# Patient Record
Sex: Male | Born: 1965 | Race: White | Hispanic: No | Marital: Married | Smoking: Never smoker
Health system: Southern US, Community
[De-identification: ages and names within clinical notes are randomized; demographics above are authoritative.]

---

## 2015-01-07 ENCOUNTER — Encounter: Payer: Self-pay | Admitting: Emergency Medicine

## 2015-01-07 ENCOUNTER — Emergency Department: Payer: No Typology Code available for payment source

## 2015-01-07 ENCOUNTER — Emergency Department
Admission: EM | Admit: 2015-01-07 | Discharge: 2015-01-07 | Disposition: A | Discharge status: Home / Self Care | Payer: No Typology Code available for payment source | Attending: Emergency Medicine | Admitting: Emergency Medicine

## 2015-01-07 DIAGNOSIS — Q893 Situs inversus: Secondary | ICD-10-CM | POA: Insufficient documentation

## 2015-01-07 DIAGNOSIS — N201 Calculus of ureter: Secondary | ICD-10-CM | POA: Insufficient documentation

## 2015-01-07 DIAGNOSIS — R197 Diarrhea, unspecified: Secondary | ICD-10-CM | POA: Diagnosis not present

## 2015-01-07 DIAGNOSIS — R1032 Left lower quadrant pain: Secondary | ICD-10-CM | POA: Diagnosis present

## 2015-01-07 LAB — COMPREHENSIVE METABOLIC PANEL
ALT: 27 U/L (ref 17–63)
ANION GAP: 10 (ref 5–15)
AST: 28 U/L (ref 15–41)
Albumin: 4.6 g/dL (ref 3.5–5.0)
Alkaline Phosphatase: 56 U/L (ref 38–126)
BUN: 17 mg/dL (ref 6–20)
CHLORIDE: 108 mmol/L (ref 101–111)
CO2: 26 mmol/L (ref 22–32)
Calcium: 9.2 mg/dL (ref 8.9–10.3)
Creatinine, Ser: 1.13 mg/dL (ref 0.61–1.24)
GFR calc Af Amer: >60 mL/min (ref >60)
GFR calc non Af Amer: >60 mL/min (ref >60)
GLUCOSE: 183 mg/dL — AB (ref 65–99)
Potassium: 3.7 mmol/L (ref 3.5–5.1)
SODIUM: 144 mmol/L (ref 135–145)
TOTAL PROTEIN: 7.3 g/dL (ref 6.5–8.1)
Total Bilirubin: 0.8 mg/dL (ref 0.3–1.2)

## 2015-01-07 LAB — CBC WITH DIFFERENTIAL/PLATELET
BASOS PCT: 1 %
Basophils Absolute: 0.1 10*3/uL (ref 0–0.1)
Eosinophils Absolute: 0.2 10*3/uL (ref 0–0.7)
Eosinophils Relative: 1 %
HEMATOCRIT: 45.2 % (ref 40.0–52.0)
HEMOGLOBIN: 14.9 g/dL (ref 13.0–18.0)
LYMPHS PCT: 21 %
Lymphs Abs: 2.8 10*3/uL (ref 1.0–3.6)
MCH: 29 pg (ref 26.0–34.0)
MCHC: 32.9 g/dL (ref 32.0–36.0)
MCV: 88.2 fL (ref 80.0–100.0)
Monocytes Absolute: 0.9 10*3/uL (ref 0.2–1.0)
Monocytes Relative: 6 %
Neutro Abs: 9.4 10*3/uL — ABNORMAL HIGH (ref 1.4–6.5)
Neutrophils Relative %: 71 %
PLATELETS: 236 10*3/uL (ref 150–440)
RBC: 5.13 MIL/uL (ref 4.40–5.90)
RDW: 13.4 % (ref 11.5–14.5)
WBC: 13.4 10*3/uL — ABNORMAL HIGH (ref 3.8–10.6)

## 2015-01-07 LAB — URINALYSIS COMPLETE WITH MICROSCOPIC (ARMC ONLY)
BILIRUBIN URINE: NEGATIVE
Glucose, UA: NEGATIVE mg/dL
Leukocytes, UA: NEGATIVE
Nitrite: NEGATIVE
Protein, ur: 100 mg/dL — AB
SPECIFIC GRAVITY, URINE: 1.028 (ref 1.005–1.030)
Squamous Epithelial / LPF: NONE SEEN
pH: 5 (ref 5.0–8.0)

## 2015-01-07 MED ORDER — HYDROMORPHONE HCL 1 MG/ML IJ SOLN
1.0000 mg | Freq: Once | INTRAMUSCULAR | Status: AC
  Administered 2015-01-07: 1 mg via INTRAVENOUS

## 2015-01-07 MED ORDER — TAMSULOSIN HCL 0.4 MG PO CAPS: 0.4000 mg | ORAL_CAPSULE | Freq: Every day | ORAL | Status: AC

## 2015-01-07 MED ORDER — HYDROMORPHONE HCL 1 MG/ML IJ SOLN
INTRAMUSCULAR | Status: AC
  Filled 2015-01-07: qty 1

## 2015-01-07 MED ORDER — TAMSULOSIN HCL 0.4 MG PO CAPS
ORAL_CAPSULE | ORAL | Status: AC
  Administered 2015-01-07: 0.4 mg via ORAL
  Filled 2015-01-07: qty 1

## 2015-01-07 MED ORDER — ONDANSETRON 4 MG PO TBDP
4.0000 mg | ORAL_TABLET | Freq: Once | ORAL | Status: AC
Start: 2015-01-07 — End: 2015-01-07
  Administered 2015-01-07: 4 mg via ORAL

## 2015-01-07 MED ORDER — ONDANSETRON 4 MG PO TBDP
ORAL_TABLET | ORAL | Status: AC
  Administered 2015-01-07: 4 mg via ORAL
  Filled 2015-01-07: qty 1

## 2015-01-07 MED ORDER — SODIUM CHLORIDE 0.9 % IV BOLUS (SEPSIS)
1000.0000 mL | Freq: Once | INTRAVENOUS | Status: AC
  Administered 2015-01-07: 1000 mL via INTRAVENOUS

## 2015-01-07 MED ORDER — HYDROMORPHONE HCL 1 MG/ML IJ SOLN
INTRAMUSCULAR | Status: AC
  Administered 2015-01-07: 1 mg via INTRAVENOUS
  Filled 2015-01-07: qty 1

## 2015-01-07 MED ORDER — ONDANSETRON HCL 4 MG PO TABS: 4.0000 mg | ORAL_TABLET | Freq: Three times a day (TID) | ORAL | Status: AC | PRN

## 2015-01-07 MED ORDER — IOHEXOL 300 MG/ML  SOLN
100.0000 mL | Freq: Once | INTRAMUSCULAR | Status: AC | PRN
  Administered 2015-01-07: 100 mL via INTRAVENOUS

## 2015-01-07 MED ORDER — ONDANSETRON HCL 4 MG/2ML IJ SOLN
4.0000 mg | Freq: Once | INTRAMUSCULAR | Status: AC
  Administered 2015-01-07: 4 mg via INTRAVENOUS

## 2015-01-07 MED ORDER — KETOROLAC TROMETHAMINE 30 MG/ML IJ SOLN: 30.0000 mg | Freq: Once | INTRAMUSCULAR | Status: DC

## 2015-01-07 MED ORDER — OXYCODONE-ACETAMINOPHEN 5-325 MG PO TABS: 1.0000 | ORAL_TABLET | Freq: Four times a day (QID) | ORAL | Status: DC | PRN

## 2015-01-07 MED ORDER — KETOROLAC TROMETHAMINE 30 MG/ML IJ SOLN
INTRAMUSCULAR | Status: AC
  Administered 2015-01-07: 30 mg via INTRAVENOUS
  Filled 2015-01-07: qty 1

## 2015-01-07 MED ORDER — TAMSULOSIN HCL 0.4 MG PO CAPS
0.4000 mg | ORAL_CAPSULE | Freq: Once | ORAL | Status: AC
  Administered 2015-01-07: 0.4 mg via ORAL

## 2015-01-07 MED ORDER — FENTANYL CITRATE (PF) 100 MCG/2ML IJ SOLN
75.0000 ug | Freq: Once | INTRAMUSCULAR | Status: AC
  Administered 2015-01-07: 75 ug via INTRAVENOUS

## 2015-01-07 MED ORDER — IOHEXOL 240 MG/ML SOLN
25.0000 mL | Freq: Once | INTRAMUSCULAR | Status: AC | PRN
  Administered 2015-01-07: 25 mL via ORAL

## 2015-01-07 MED ORDER — KETOROLAC TROMETHAMINE 30 MG/ML IJ SOLN
30.0000 mg | Freq: Once | INTRAMUSCULAR | Status: AC
  Administered 2015-01-07: 30 mg via INTRAVENOUS

## 2015-01-07 MED ORDER — ONDANSETRON HCL 4 MG PO TABS: 4.0000 mg | ORAL_TABLET | Freq: Once | ORAL | Status: DC

## 2015-01-07 MED ORDER — ONDANSETRON HCL 4 MG/2ML IJ SOLN
INTRAMUSCULAR | Status: AC
  Administered 2015-01-07: 4 mg via INTRAVENOUS
  Filled 2015-01-07: qty 2

## 2015-01-07 MED ORDER — HYDROMORPHONE HCL 1 MG/ML IJ SOLN
INTRAMUSCULAR | Status: AC
Start: 2015-01-07 — End: 2015-01-07
  Administered 2015-01-07: 1 mg via INTRAVENOUS
  Filled 2015-01-07: qty 1

## 2015-01-07 MED ORDER — FENTANYL CITRATE (PF) 100 MCG/2ML IJ SOLN
INTRAMUSCULAR | Status: AC
  Administered 2015-01-07: 75 ug via INTRAVENOUS
  Filled 2015-01-07: qty 2

## 2015-01-07 NOTE — ED Notes (Signed)
MD aware of patients vitals

## 2015-01-07 NOTE — ED Notes (Signed)
When RN was discharging pt, pt vomited liquid several times in the hallway; brought patient back to room and notified MD.

## 2015-01-07 NOTE — ED Notes (Signed)
Pt in room with family at bedside.

## 2015-01-07 NOTE — ED Notes (Signed)
MD reassessed pt's  SPO2 . Pt able to go home.

## 2015-01-07 NOTE — ED Notes (Signed)
Pt back from CT. MD aware of pt's pain . MD to update patient on care plan.

## 2015-01-07 NOTE — Discharge Instructions (Signed)

## 2015-01-07 NOTE — ED Notes (Signed)
Pt states that he passed blood in his urine yesterday, states that after he voided an hour ago he started having severe pain in his left flank and llq, pt is vomiting with the pain, no hx of kidney stones

## 2015-01-07 NOTE — ED Provider Notes (Addendum)
[facility] Emergency Department Provider Note  ____________________________________________  Time seen: Approximately 445 PM  I have reviewed the triage vital signs and the nursing notes.   HISTORY  Chief Complaint Flank Pain    HPI Jon Fitzgerald is a 49 y.o. male with a family history of kidney stones who presents today with left flank pain radiating into his back and left lower quadrant with blood in his urine times one yesterday. Patient is having worsening pain today with nausea vomiting and diarrhea. Pain is severe and sharp. No blood in the vomit or diarrhea.No burning on urination.   History reviewed. No pertinent past medical history.  There are no active problems to display for this patient.   History reviewed. No pertinent past surgical history.  No current outpatient prescriptions on file.  Allergies Review of patient's allergies indicates no known allergies.  No family history on file.  Social History History  Substance Use Topics  . Smoking status: Never Smoker   . Smokeless tobacco: Not on file  . Alcohol Use: No    Review of Systems Constitutional: No fever/chills Eyes: No visual changes. ENT: No sore throat. Cardiovascular: Denies chest pain. Respiratory: Denies shortness of breath. Gastrointestinal: As above  Genitourinary: Negative for dysuria. Musculoskeletal: Left lower lumbar pain  Skin: Negative for rash. Neurological: Negative for headaches, focal weakness or numbness.  10-point ROS otherwise negative.  ____________________________________________   PHYSICAL EXAM:  VITAL SIGNS: ED Triage Vitals  Enc Vitals Group     BP 01/07/15 1510 133/110 mmHg     Pulse Rate 01/07/15 1510 100     Resp --      Temp 01/07/15 1510 98.2 F (36.8 C)     Temp Source 01/07/15 1510 Oral     SpO2 01/07/15 1510 96 %     Weight 01/07/15 1510 175 lb (79.379 kg)     Height 01/07/15 1510  (1.778 m)     Head Cir --       Peak Flow --      Pain Score 01/07/15 1511 9     Pain Loc --      Pain Edu? --      Excl. in GC? --     Constitutional: Alert and oriented. Well appearing and in no acute distress. Eyes: Conjunctivae are normal. PERRL. EOMI. Head: Atraumatic. Nose: No congestion/rhinnorhea. Mouth/Throat: Mucous membranes are moist.  Oropharynx non-erythematous. Neck: No stridor.   Cardiovascular: Normal rate, regular rhythm. Grossly normal heart sounds.  Good peripheral circulation. Respiratory: Normal respiratory effort.  No retractions. Lungs CTAB. Gastrointestinal: Soft and nontender. No distention. No abdominal bruits. No CVA tenderness. Musculoskeletal: No lower extremity tenderness nor edema.  No joint effusions. Neurologic:  Normal speech and language. No gross focal neurologic deficits are appreciated. Speech is normal. No gait instability. Skin:  Skin is warm, dry and intact. No rash noted. Psychiatric: Mood and affect are normal. Speech and behavior are normal.  ____________________________________________   LABS (all labs ordered are listed, but only abnormal results are displayed)  Labs Reviewed  CBC WITH DIFFERENTIAL/PLATELET - Abnormal; Notable for the following:    WBC 13.4 (*)    Neutro Abs 9.4 (*)    All other components within normal limits  URINALYSIS COMPLETEWITH MICROSCOPIC ([facility] ONLY) - Abnormal; Notable for the following:    Color, Urine AMBER (*)    APPearance CLOUDY (*)    Ketones, ur TRACE (*)    Hgb urine dipstick 3+ (*)  Protein, ur 100 (*)    Bacteria, UA RARE (*)    All other components within normal limits  COMPREHENSIVE METABOLIC PANEL - Abnormal; Notable for the following:    Glucose, Bld 183 (*)    All other components within normal limits   ____________________________________________  EKG   ____________________________________________  RADIOLOGY  Potential bilateral nonobstructing renal calculi. No evidence renal mass or hydronephrosis.  Spleen on right and liver and left ____________________________________________   PROCEDURES    ____________________________________________   INITIAL IMPRESSION / ASSESSMENT AND PLAN / ED COURSE  Pertinent labs & imaging results that were available during my care of the patient were reviewed by me and considered in my medical decision making (see chart for details).  ----------------------------------------- 5:45 PM on 01/07/2015 -----------------------------------------  Patient now resting comfortably after multiple doses of IV pain medications. Because of likely situs inversus on the ultrasound I will proceed with a CAT scan secondary to left lower quadrant tenderness with possible appendicitis.  ----------------------------------------- 8:13 PM on 01/07/2015 -----------------------------------------  Recent pain now controlled after several doses of Dilaudid. Discussed case with Dr.Escridge of urology. Aware of stone position as proximal in size of stone and this patient is appropriate for trial of outpatient treatment. We will give patient the phone number for urology on call here for follow-up. However, the patient does [facility] I advised the patient that he may need to call his insurance company to see what urologist is in his network for follow-up. The patient also understands that he will need to return if he feels like he is developing a fever worse having chills or uncontrolled pain. We'll discharge with a urine strainer. Discussed also the gallstone in the gallbladder neck with the patient. However, the patient continues to have no left upper quadrant tenderness to palpation. I did advise the patient that whenever he goes to the doctor from now on he needs to tell his provider that his organs are on the opposite side of his belly. The patient understands the plan and is willing to comply. We'll discharge to  home. 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    Myrna Blazeravid Matthew Schaevitz, MD 01/07/15 2015  Age and observed for about 1.5 hours in addition because feeling very groggy and hypoxic to 80s on room air. Likely secondary to opiates. Patient also with several episodes of vomiting however patient saying now that he feels good does not feel groggy anymore. Last saturation on room air is 95%. We'll give Zofran prescription to go home with in addition other scripts. We'll discharge.  Myrna Blazeravid Matthew Schaevitz, MD 01/07/15 [id number]

## 2015-01-10 ENCOUNTER — Emergency Department: Payer: No Typology Code available for payment source

## 2015-01-10 ENCOUNTER — Emergency Department
Admission: EM | Admit: 2015-01-10 | Discharge: 2015-01-10 | Disposition: A | Discharge status: Home / Self Care | Payer: No Typology Code available for payment source | Attending: Emergency Medicine | Admitting: Emergency Medicine

## 2015-01-10 DIAGNOSIS — N200 Calculus of kidney: Secondary | ICD-10-CM | POA: Insufficient documentation

## 2015-01-10 DIAGNOSIS — R109 Unspecified abdominal pain: Secondary | ICD-10-CM | POA: Diagnosis present

## 2015-01-10 DIAGNOSIS — K8020 Calculus of gallbladder without cholecystitis without obstruction: Secondary | ICD-10-CM | POA: Insufficient documentation

## 2015-01-10 DIAGNOSIS — R1013 Epigastric pain: Secondary | ICD-10-CM

## 2015-01-10 DIAGNOSIS — Z79899 Other long term (current) drug therapy: Secondary | ICD-10-CM | POA: Insufficient documentation

## 2015-01-10 LAB — COMPREHENSIVE METABOLIC PANEL
ALBUMIN: 3.6 g/dL (ref 3.5–5.0)
ALT: 19 U/L (ref 17–63)
ANION GAP: 10 (ref 5–15)
AST: 17 U/L (ref 15–41)
Alkaline Phosphatase: 51 U/L (ref 38–126)
BILIRUBIN TOTAL: 0.9 mg/dL (ref 0.3–1.2)
BUN: 17 mg/dL (ref 6–20)
CALCIUM: 8.5 mg/dL — AB (ref 8.9–10.3)
CO2: 24 mmol/L (ref 22–32)
Chloride: 107 mmol/L (ref 101–111)
Creatinine, Ser: 1.71 mg/dL — ABNORMAL HIGH (ref 0.61–1.24)
GFR calc Af Amer: 53 mL/min — ABNORMAL LOW (ref >60)
GFR calc non Af Amer: 46 mL/min — ABNORMAL LOW (ref >60)
Glucose, Bld: 127 mg/dL — ABNORMAL HIGH (ref 65–99)
Potassium: 3.7 mmol/L (ref 3.5–5.1)
Sodium: 141 mmol/L (ref 135–145)
TOTAL PROTEIN: 6.4 g/dL — AB (ref 6.5–8.1)

## 2015-01-10 LAB — URINALYSIS COMPLETE WITH MICROSCOPIC (ARMC ONLY)
Bilirubin Urine: NEGATIVE
Glucose, UA: NEGATIVE mg/dL
LEUKOCYTES UA: NEGATIVE
Nitrite: NEGATIVE
PH: 5 (ref 5.0–8.0)
Protein, ur: NEGATIVE mg/dL
SPECIFIC GRAVITY, URINE: 1.019 (ref 1.005–1.030)
SQUAMOUS EPITHELIAL / LPF: NONE SEEN

## 2015-01-10 LAB — CBC
HCT: 41 % (ref 40.0–52.0)
HEMOGLOBIN: 14.1 g/dL (ref 13.0–18.0)
MCH: 30 pg (ref 26.0–34.0)
MCHC: 34.3 g/dL (ref 32.0–36.0)
MCV: 87.4 fL (ref 80.0–100.0)
PLATELETS: 172 10*3/uL (ref 150–440)
RBC: 4.7 MIL/uL (ref 4.40–5.90)
RDW: 13.2 % (ref 11.5–14.5)
WBC: 10.4 10*3/uL (ref 3.8–10.6)

## 2015-01-10 MED ORDER — MORPHINE SULFATE 4 MG/ML IJ SOLN
INTRAMUSCULAR | Status: AC
  Administered 2015-01-10: 4 mg via INTRAVENOUS
  Filled 2015-01-10: qty 1

## 2015-01-10 MED ORDER — MORPHINE SULFATE 4 MG/ML IJ SOLN
4.0000 mg | Freq: Once | INTRAMUSCULAR | Status: AC
  Administered 2015-01-10: 4 mg via INTRAVENOUS

## 2015-01-10 MED ORDER — KETOROLAC TROMETHAMINE 30 MG/ML IJ SOLN
30.0000 mg | Freq: Once | INTRAMUSCULAR | Status: AC
  Administered 2015-01-10: 30 mg via INTRAVENOUS

## 2015-01-10 MED ORDER — SODIUM CHLORIDE 0.9 % IV BOLUS (SEPSIS)
1000.0000 mL | Freq: Once | INTRAVENOUS | Status: AC
  Administered 2015-01-10: 1000 mL via INTRAVENOUS

## 2015-01-10 MED ORDER — MORPHINE SULFATE 2 MG/ML IJ SOLN
INTRAMUSCULAR | Status: AC
  Administered 2015-01-10: 2 mg via INTRAVENOUS
  Filled 2015-01-10: qty 1

## 2015-01-10 MED ORDER — OXYCODONE-ACETAMINOPHEN 5-325 MG PO TABS: 2.0000 | ORAL_TABLET | ORAL | Status: AC | PRN

## 2015-01-10 MED ORDER — MORPHINE SULFATE 2 MG/ML IJ SOLN
2.0000 mg | Freq: Once | INTRAMUSCULAR | Status: AC
  Administered 2015-01-10: 2 mg via INTRAVENOUS

## 2015-01-10 MED ORDER — ONDANSETRON HCL 4 MG/2ML IJ SOLN
4.0000 mg | Freq: Once | INTRAMUSCULAR | Status: AC
  Administered 2015-01-10: 4 mg via INTRAVENOUS

## 2015-01-10 MED ORDER — ONDANSETRON HCL 4 MG/2ML IJ SOLN
INTRAMUSCULAR | Status: AC
  Administered 2015-01-10: 4 mg via INTRAVENOUS
  Filled 2015-01-10: qty 2

## 2015-01-10 MED ORDER — KETOROLAC TROMETHAMINE 30 MG/ML IJ SOLN
INTRAMUSCULAR | Status: AC
  Administered 2015-01-10: 30 mg via INTRAVENOUS
  Filled 2015-01-10: qty 11

## 2015-01-10 NOTE — ED Notes (Signed)
Pt to triage via w/c, appears uncomfortable; pt reports kidney stone; c/o left side pain radiating into lower abd accomp by nausea since Sunday (seen here)

## 2015-01-10 NOTE — ED Notes (Signed)
Pt alert and oriented X4, active, cooperative, pt in NAD. RR even and unlabored, color WNL.  Pt informed to return if any life threatening symptoms occur.   

## 2015-01-10 NOTE — Discharge Instructions (Signed)
Cholelithiasis Cholelithiasis (also called gallstones) is a form of gallbladder disease in which gallstones form in your gallbladder. The gallbladder is an organ that stores bile made in the liver, which helps digest fats. Gallstones begin as small crystals and slowly grow into stones. Gallstone pain occurs when the gallbladder spasms and a gallstone is blocking the duct. Pain can also occur when a stone passes out of the duct.  RISK FACTORS  Being male.   Having multiple pregnancies. Health care providers sometimes advise removing diseased gallbladders before future pregnancies.   Being obese.  Eating a diet heavy in fried foods and fat.   Being older than 60 years and increasing age.   Prolonged use of medicines containing male hormones.   Having diabetes mellitus.   Rapidly losing weight.   Having a family history of gallstones (heredity).  SYMPTOMS  Nausea.   Vomiting.  Abdominal pain.   Yellowing of the skin (jaundice).   Sudden pain. It may persist from several minutes to several hours.  Fever.   Tenderness to the touch. In some cases, when gallstones do not move into the bile duct, people have no pain or symptoms. These are called "silent" gallstones.  TREATMENT Silent gallstones do not need treatment. In severe cases, emergency surgery may be required. Options for treatment include:  Surgery to remove the gallbladder. This is the most common treatment.  Medicines. These do not always work and may take 6-12 months or more to work.  Shock wave treatment (extracorporeal biliary lithotripsy). In this treatment an ultrasound machine sends shock waves to the gallbladder to break gallstones into smaller pieces that can pass into the intestines or be dissolved by medicine. HOME CARE INSTRUCTIONS   Only take over-the-counter or prescription medicines for pain, discomfort, or fever as directed by your health care provider.   Follow a low-fat diet until  seen again by your health care provider. Fat causes the gallbladder to contract, which can result in pain.   Follow up with your health care provider as directed. Attacks are almost always recurrent and surgery is usually required for permanent treatment.  SEEK IMMEDIATE MEDICAL CARE IF:   Your pain increases and is not controlled by medicines.   You have a fever or persistent symptoms for more than 2-3 days.   You have a fever and your symptoms suddenly get worse.   You have persistent nausea and vomiting.  MAKE SURE YOU:   Understand these instructions.  Will watch your condition.  Will get help right away if you are not doing well or get worse. Document Released: 07/17/2005 Document Revised: 03/23/2013 Document Reviewed: 01/12/2013 [facility] Patient Information 2015 [place], [place]. This information is not intended to replace advice given to you by your health care provider. Make sure you discuss any questions you have with your health care provider.  Kidney Stones Kidney stones (urolithiasis) are deposits that form inside your kidneys. The intense pain is caused by the stone moving through the urinary tract. When the stone moves, the ureter goes into spasm around the stone. The stone is usually passed in the urine.  CAUSES   A disorder that makes certain neck glands produce too much parathyroid hormone (primary hyperparathyroidism).  A buildup of uric acid crystals, similar to gout in your joints.  Narrowing (stricture) of the ureter.  A kidney obstruction present at birth (congenital obstruction).  Previous surgery on the kidney or ureters.  Numerous kidney infections. SYMPTOMS   Feeling sick to your stomach (nauseous).  Throwing up (vomiting).  Blood in the urine (hematuria).  Pain that usually spreads (radiates) to the groin.  Frequency or urgency of urination. DIAGNOSIS   Taking a history and physical exam.  Blood or urine tests.  CT  scan.  Occasionally, an examination of the inside of the urinary bladder (cystoscopy) is performed. TREATMENT   Observation.  Increasing your fluid intake.  Extracorporeal shock wave lithotripsy--This is a noninvasive procedure that uses shock waves to break up kidney stones.  Surgery may be needed if you have severe pain or persistent obstruction. There are various surgical procedures. Most of the procedures are performed with the use of small instruments. Only small incisions are needed to accommodate these instruments, so recovery time is minimized. The size, location, and chemical composition are all important variables that will determine the proper choice of action for you. Talk to your health care provider to better understand your situation so that you will minimize the risk of injury to yourself and your kidney.  HOME CARE INSTRUCTIONS   Drink enough water and fluids to keep your urine clear or pale yellow. This will help you to pass the stone or stone fragments.  Strain all urine through the provided strainer. Keep all particulate matter and stones for your health care provider to see. The stone causing the pain may be as small as a grain of salt. It is very important to use the strainer each and every time you pass your urine. The collection of your stone will allow your health care provider to analyze it and verify that a stone has actually passed. The stone analysis will often identify what you can do to reduce the incidence of recurrences.  Only take over-the-counter or prescription medicines for pain, discomfort, or fever as directed by your health care provider.  Make a follow-up appointment with your health care provider as directed.  Get follow-up X-rays if required. The absence of pain does not always mean that the stone has passed. It may have only stopped moving. If the urine remains completely obstructed, it can cause loss of kidney function or even complete destruction  of the kidney. It is your responsibility to make sure X-rays and follow-ups are completed. Ultrasounds of the kidney can show blockages and the status of the kidney. Ultrasounds are not associated with any radiation and can be performed easily in a matter of minutes. SEEK MEDICAL CARE IF:  You experience pain that is progressive and unresponsive to any pain medicine you have been prescribed. SEEK IMMEDIATE MEDICAL CARE IF:   Pain cannot be controlled with the prescribed medicine.  You have a fever or shaking chills.  The severity or intensity of pain increases over 18 hours and is not relieved by pain medicine.  You develop a new onset of abdominal pain.  You feel faint or pass out.  You are unable to urinate. MAKE SURE YOU:   Understand these instructions.  Will watch your condition.  Will get help right away if you are not doing well or get worse. Document Released: 07/21/2005 Document Revised: 03/23/2013 Document Reviewed: 12/22/2012 [facility] Patient Information 2015 [place], [place]. This information is not intended to replace advice given to you by your health care provider. Make sure you discuss any questions you have with your health care provider.

## 2015-01-12 NOTE — ED Provider Notes (Signed)
[facility] Emergency Department Provider Note  ____________________________________________  Time seen: 3:40 AM  I have reviewed the triage vital signs and the nursing notes.   HISTORY  Chief Complaint Flank Pain      HPI Jon Fitzgerald is a 49 y.o. male presents with worsening left flank pain. Of note patient was recently diagnosed with a kidney stone on 01/07/2015. Patient states pain is uncontrolled at home current pain score is 10 out of 10. Patient denies any aggravating or alleviating factors.   Past medical history Kidney stone Situs ambiguous    History reviewed. No pertinent past surgical history.  Current Outpatient Rx  Name  Route  Sig  Dispense  Refill  . oxyCODONE-acetaminophen (PERCOCET/ROXICET) 5-325 MG per tablet   Oral   Take 1-2 tablets by mouth every 6 (six) hours as needed for severe pain.         . tamsulosin (FLOMAX) 0.4 MG CAPS capsule   Oral   Take 1 capsule (0.4 mg total) by mouth daily.   30 capsule   0   . ondansetron (ZOFRAN) 4 MG tablet   Oral   Take 1 tablet (4 mg total) by mouth every 8 (eight) hours as needed for nausea or vomiting. Patient not taking: Reported on 01/10/2015   10 tablet   0   . oxyCODONE-acetaminophen (ROXICET) 5-325 MG per tablet   Oral   Take 2 tablets by mouth every 4 (four) hours as needed.   20 tablet   0     Allergies Review of patient's allergies indicates no known allergies.  History reviewed. No pertinent family history.  Social History History  Substance Use Topics  . Smoking status: Never Smoker   . Smokeless tobacco: Not on file  . Alcohol Use: No    Review of Systems  Constitutional: Negative for fever. Eyes: Negative for visual changes. ENT: Negative for sore throat. Cardiovascular: Negative for chest pain. Respiratory: Negative for shortness of breath. Gastrointestinal: Negative for abdominal pain, vomiting and diarrhea. Positive for left flank  pain Genitourinary: Negative for dysuria. Musculoskeletal: Negative for back pain. Skin: Negative for rash. Neurological: Negative for headaches, focal weakness or numbness.   10-point ROS otherwise negative.  ____________________________________________   PHYSICAL EXAM:  VITAL SIGNS: ED Triage Vitals  Enc Vitals Group     BP 01/10/15 0343 139/91 mmHg     Pulse Rate 01/10/15 0343 82     Resp 01/10/15 0343 20     Temp 01/10/15 0343 98 F (36.7 C)     Temp Source 01/10/15 0343 Oral     SpO2 01/10/15 0343 95 %     Weight 01/10/15 0343 175 lb (79.379 kg)     Height 01/10/15 0343 5\' 10"  (1.778 m)     Head Cir --      Peak Flow --      Pain Score 01/10/15 0344 4     Pain Loc --      Pain Edu? --      Excl. in GC? --      Constitutional: Alert and oriented. Well appearing and in no distress. Eyes: Conjunctivae are normal. PERRL. Normal extraocular movements. ENT   Head: Normocephalic and atraumatic.   Nose: No congestion/rhinnorhea.   Mouth/Throat: Mucous membranes are moist.   Neck: No stridor. Hematological/Lymphatic/Immunilogical: No cervical lymphadenopathy. Cardiovascular: Normal rate, regular rhythm. Normal and symmetric distal pulses are present in all extremities. No murmurs, rubs, or gallops. Respiratory: Normal respiratory effort without tachypnea nor  retractions. Breath sounds are clear and equal bilaterally. No wheezes/rales/rhonchi. Gastrointestinal: Soft and nontender. No distention. There is no CVA tenderness. Genitourinary: deferred Musculoskeletal: Nontender with normal range of motion in all extremities. No joint effusions.  No lower extremity tenderness nor edema. Neurologic:  Normal speech and language. No gross focal neurologic deficits are appreciated. Speech is normal.  Skin:  Skin is warm, dry and intact. No rash noted. Psychiatric: Mood and affect are normal. Speech and behavior are normal. Patient exhibits appropriate insight and  judgment.  ____________________________________________    LABS (pertinent positives/negatives)  Labs Reviewed  COMPREHENSIVE METABOLIC PANEL - Abnormal; Notable for the following:    Glucose, Bld 127 (*)    Creatinine, Ser 1.71 (*)    Calcium 8.5 (*)    Total Protein 6.4 (*)    GFR calc non Af Amer 46 (*)    GFR calc Af Amer 53 (*)    All other components within normal limits  URINALYSIS COMPLETEWITH MICROSCOPIC ([facility] ONLY) - Abnormal; Notable for the following:    Color, Urine YELLOW (*)    APPearance CLEAR (*)    Ketones, ur 1+ (*)    Hgb urine dipstick 1+ (*)    Bacteria, UA RARE (*)    All other components within normal limits  CBC       RADIOLOGY  Ultrasound revealed cholelithiasis without evidence of cholecystitis____________________________________________     INITIAL IMPRESSION / ASSESSMENT AND PLAN / ED COURSE  Pertinent labs & imaging results that were available during my care of the patient were reviewed by me and considered in my medical decision making (see chart for details).  Patient received multiple doses of IV morphine for analgesia in the emergency department with pain control achieved. Patient will be discharged home to follow up with urology for kidney stones and general surgery for gallstones  ____________________________________________   FINAL CLINICAL IMPRESSION(S) / ED DIAGNOSES  Final diagnoses:  Gallstones  Kidney stone      Darci Current, MD 01/12/15 [phone]

## 2016-01-05 IMAGING — CT CT ABD-PELV W/ CM
1 of 3 series · 13 of 32 positions shown, 18 images · IV contrast (omnipaque)
Comparison: Abdominal ultrasound dated 01/07/2015

CLINICAL DATA: Hematuria.  Left lower quadrant pain.

EXAM:
CT ABDOMEN AND PELVIS WITH CONTRAST
TECHNIQUE: Multidetector CT imaging of the abdomen and pelvis was performed
using the standard protocol following bolus administration of
intravenous contrast.
CONTRAST:  100mL OMNIPAQUE IOHEXOL 300 MG/ML  SOLN

[Series 2: routine abd pel with · axial · 0.70mm/px · z∈[-520,-70]mm · 13 of 100 slices shown, 18 images]
[im 5/100  soft-tissue]
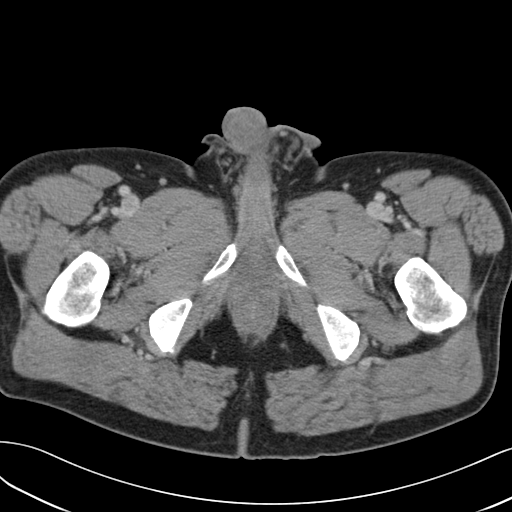
[im 5/100  bone]
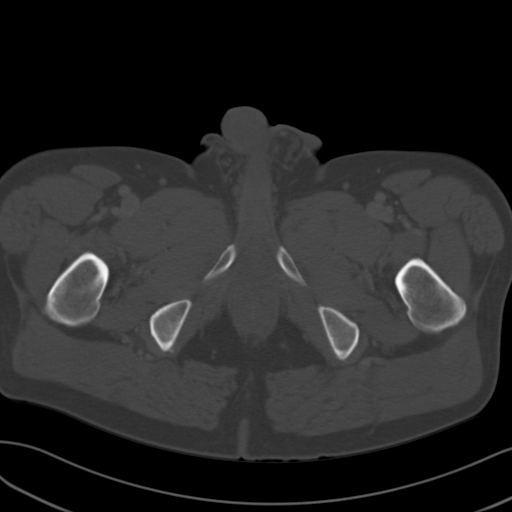
[im 15/100  soft-tissue]
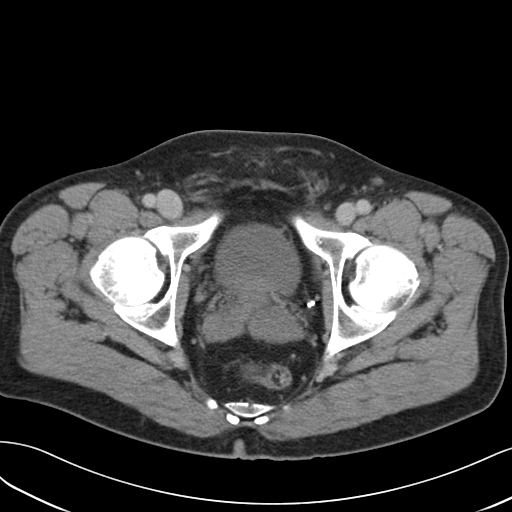
[im 20/100  soft-tissue]
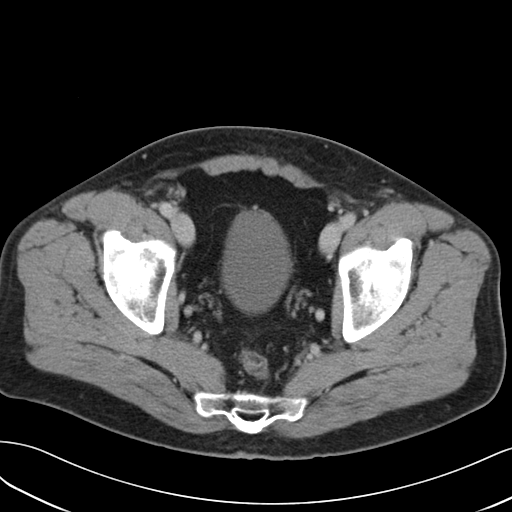
[im 30/100  soft-tissue]
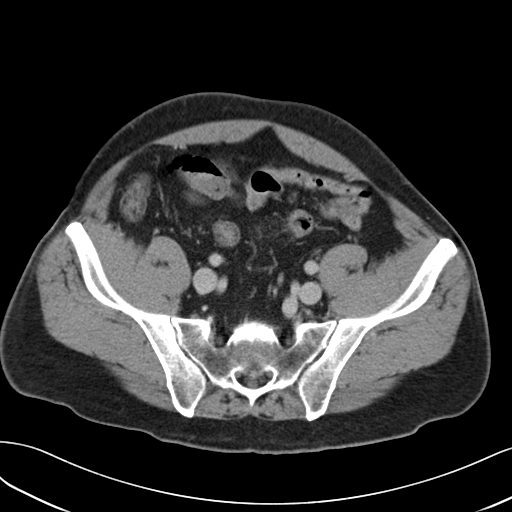
[im 40/100  soft-tissue]
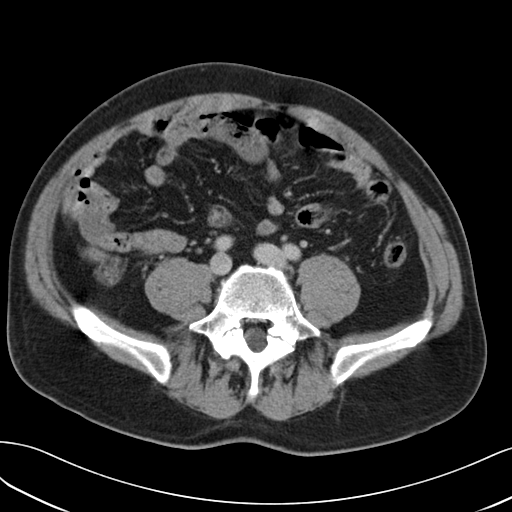
[im 45/100  soft-tissue]
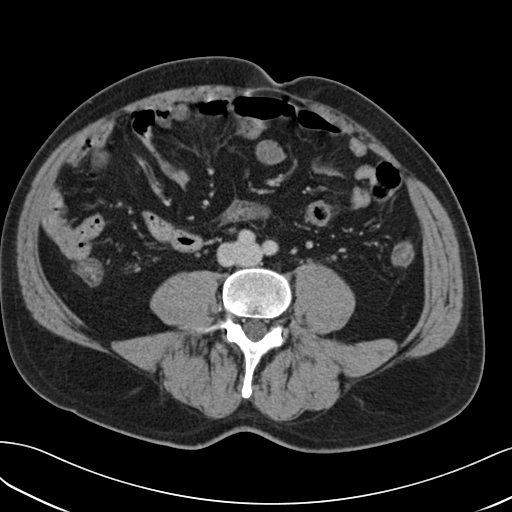
[im 55/100  soft-tissue]
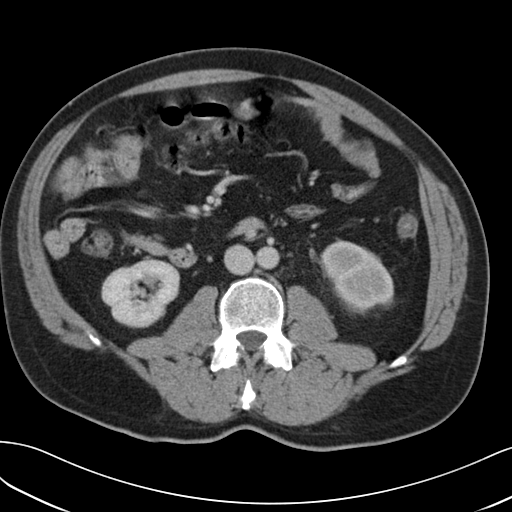
[im 60/100  soft-tissue]
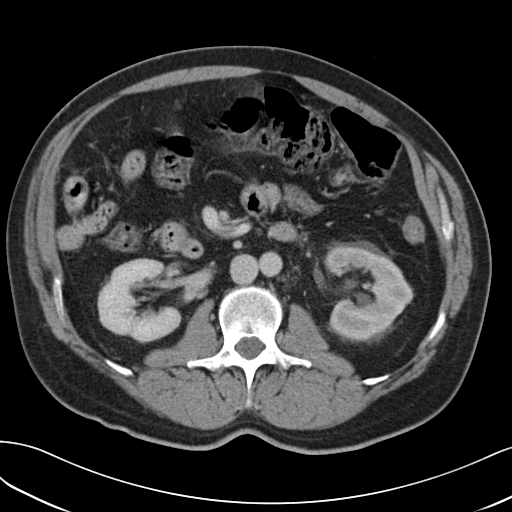
[im 70/100  soft-tissue]
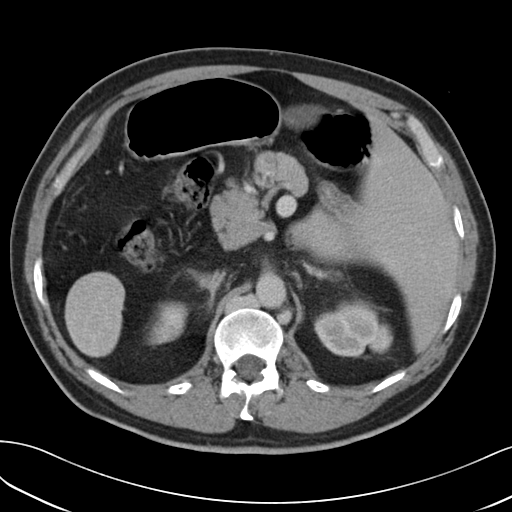
[im 70/100  bone]
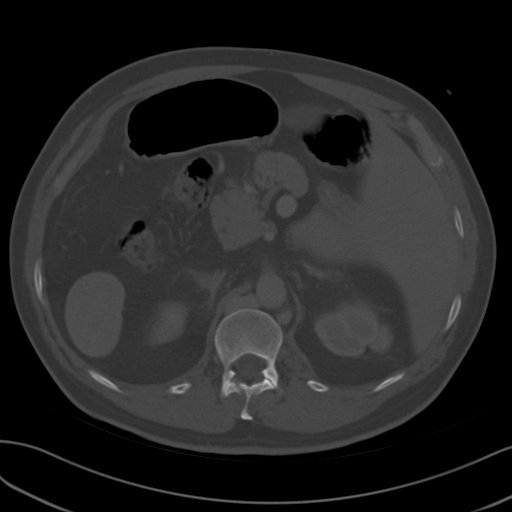
[im 80/100  soft-tissue]
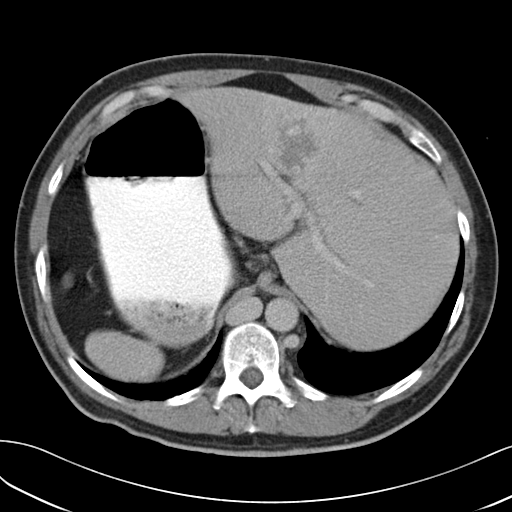
[im 80/100  lung]
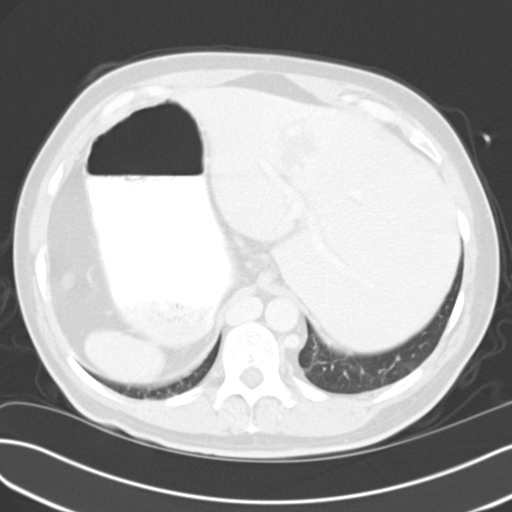
[im 85/100  soft-tissue]
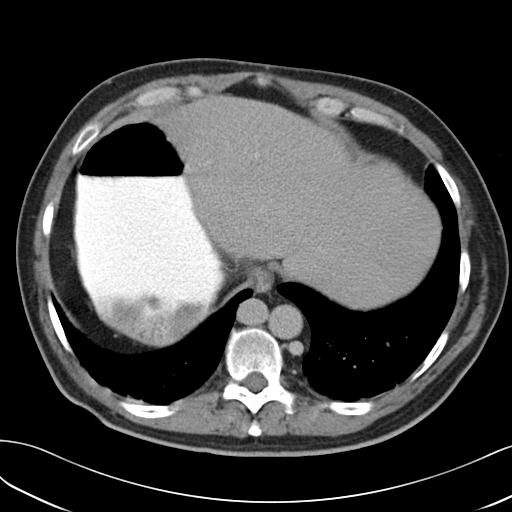
[im 85/100  lung]
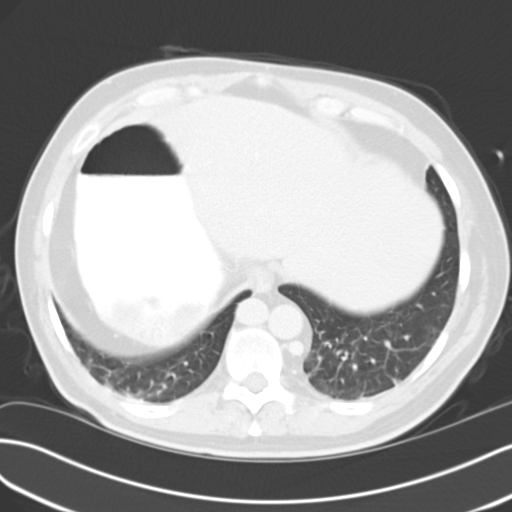
[im 90/100  lung]
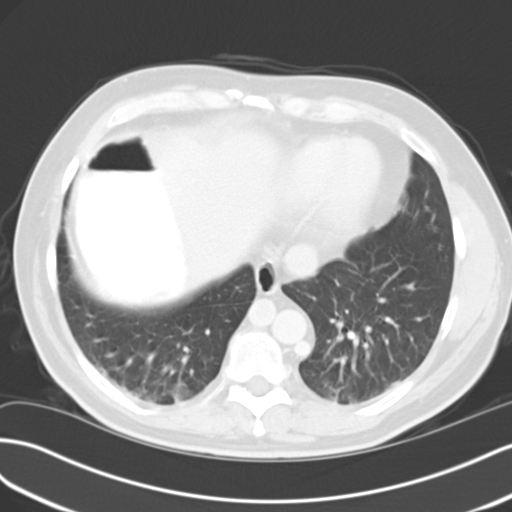
[im 95/100  soft-tissue]
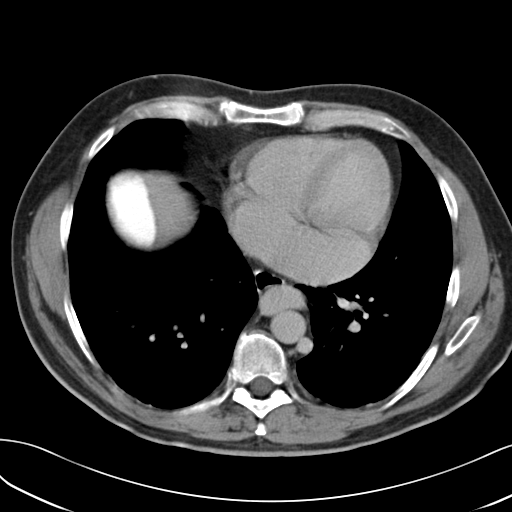
[im 95/100  lung]
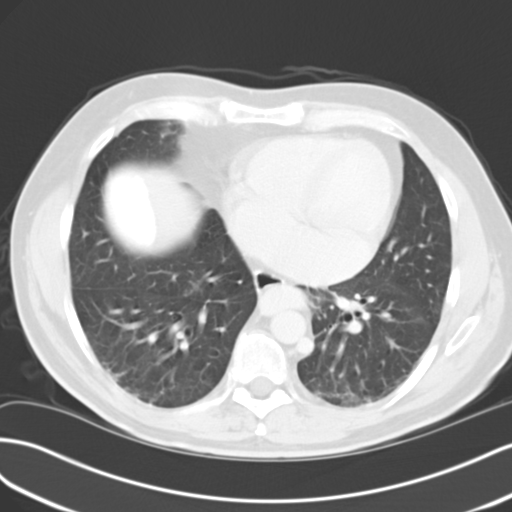

[13 of 32 positions shown; findings below may reference images not displayed]

FINDINGS: There is a 5 mm stone obstructing the proximal left ureter with
slight left hydronephrosis and slight extravasation of urine into
the perinephric space.

The patient has situs ambiguous with the liver on the left. The
patient has polysplenia in the right upper quadrant. However, the
inferior vague in an cava is on the normal side to the right of the
aorta but crosses to the left side in the chest and does not enter
the right atrium.

The descending colon as on the left in the normal position. However,
the cecum does lie in the left upper quadrant just below the liver.
The terminal ileum and appendix appear normal other than there
location.

No significant osseous abnormality.

The stomach is in the right upper quadrant. There is a 19 mm stone
in the neck of the gallbladder. There is a 3.9 cm hemangioma in the
liver immediately adjacent to the gallbladder fossa. The gallbladder
wall is not thickened. No dilated bile ducts. The pancreas is in the
midline. There is malrotation of the duodenum.

The left renal vein enters what appears to be the hemi azygous vein
and extends into the left side of the chest.

The adrenal glands and right kidney appear normal. Bladder and
prostate gland appear normal.
IMPRESSION: 1. 5 mm stone obstructing the proximal left ureter with slight left
hydronephrosis.
2. Situs ambiguous with polysplenia with multiple anomalies of the
abdominal and chest anatomy.
3. Benign hemangioma in the liver adjacent to the gallbladder.
4. 19 mm stone in the neck of the gallbladder.
5. The inferior vena cava does not enter the right atrium. I suspect
it enters the coronary sinus. However, the chest anatomy is not
completely visualized.

## 2021-07-04 HISTORY — PX: CHOLECYSTECTOMY: SHX55

## 2021-09-23 ENCOUNTER — Emergency Department
Admission: EM | Admit: 2021-09-23 | Discharge: 2021-09-24 | Disposition: A | Discharge status: Home / Self Care | Payer: BC Managed Care – PPO | Attending: Emergency Medicine | Admitting: Emergency Medicine

## 2021-09-23 ENCOUNTER — Other Ambulatory Visit: Payer: Self-pay

## 2021-09-23 ENCOUNTER — Emergency Department: Payer: BC Managed Care – PPO

## 2021-09-23 ENCOUNTER — Ambulatory Visit
Admission: EM | Admit: 2021-09-23 | Discharge: 2021-09-23 | Payer: BC Managed Care – PPO | Attending: Emergency Medicine | Admitting: Emergency Medicine

## 2021-09-23 DIAGNOSIS — Z20822 Contact with and (suspected) exposure to covid-19: Secondary | ICD-10-CM | POA: Diagnosis not present

## 2021-09-23 DIAGNOSIS — R110 Nausea: Secondary | ICD-10-CM | POA: Insufficient documentation

## 2021-09-23 DIAGNOSIS — R0602 Shortness of breath: Secondary | ICD-10-CM | POA: Insufficient documentation

## 2021-09-23 DIAGNOSIS — R509 Fever, unspecified: Secondary | ICD-10-CM | POA: Diagnosis not present

## 2021-09-23 DIAGNOSIS — R197 Diarrhea, unspecified: Secondary | ICD-10-CM | POA: Diagnosis not present

## 2021-09-23 DIAGNOSIS — M549 Dorsalgia, unspecified: Secondary | ICD-10-CM | POA: Insufficient documentation

## 2021-09-23 DIAGNOSIS — Z5321 Procedure and treatment not carried out due to patient leaving prior to being seen by health care provider: Secondary | ICD-10-CM | POA: Diagnosis not present

## 2021-09-23 LAB — COMPREHENSIVE METABOLIC PANEL
ALT: 22 U/L (ref 0–44)
AST: 23 U/L (ref 15–41)
Albumin: 4.2 g/dL (ref 3.5–5.0)
Alkaline Phosphatase: 65 U/L (ref 38–126)
Anion gap: 11 (ref 5–15)
BUN: 17 mg/dL (ref 6–20)
CO2: 26 mmol/L (ref 22–32)
Calcium: 8.9 mg/dL (ref 8.9–10.3)
Chloride: 101 mmol/L (ref 98–111)
Creatinine, Ser: 1.12 mg/dL (ref 0.61–1.24)
GFR, Estimated: >60 mL/min (ref >60)
Glucose, Bld: 122 mg/dL — ABNORMAL HIGH (ref 70–99)
Potassium: 3.6 mmol/L (ref 3.5–5.1)
Sodium: 138 mmol/L (ref 135–145)
Total Bilirubin: 0.8 mg/dL (ref 0.3–1.2)
Total Protein: 6.8 g/dL (ref 6.5–8.1)

## 2021-09-23 LAB — URINALYSIS, ROUTINE W REFLEX MICROSCOPIC
Bilirubin Urine: NEGATIVE
Glucose, UA: NEGATIVE mg/dL
Hgb urine dipstick: NEGATIVE
Ketones, ur: NEGATIVE mg/dL
Leukocytes,Ua: NEGATIVE
Nitrite: NEGATIVE
Protein, ur: NEGATIVE mg/dL
Specific Gravity, Urine: 1.021 (ref 1.005–1.030)
pH: 5 (ref 5.0–8.0)

## 2021-09-23 LAB — CBC WITH DIFFERENTIAL/PLATELET
Abs Immature Granulocytes: 0.02 10*3/uL (ref 0.00–0.07)
Basophils Absolute: 0 10*3/uL (ref 0.0–0.1)
Basophils Relative: 0 %
Eosinophils Absolute: 0 10*3/uL (ref 0.0–0.5)
Eosinophils Relative: 0 %
HCT: 45.2 % (ref 39.0–52.0)
Hemoglobin: 15.1 g/dL (ref 13.0–17.0)
Immature Granulocytes: 0 %
Lymphocytes Relative: 4 %
Lymphs Abs: 0.3 10*3/uL — ABNORMAL LOW (ref 0.7–4.0)
MCH: 30 pg (ref 26.0–34.0)
MCHC: 33.4 g/dL (ref 30.0–36.0)
MCV: 89.7 fL (ref 80.0–100.0)
Monocytes Absolute: 0.4 10*3/uL (ref 0.1–1.0)
Monocytes Relative: 5 %
Neutro Abs: 6.7 10*3/uL (ref 1.7–7.7)
Neutrophils Relative %: 91 %
Platelets: 215 10*3/uL (ref 150–400)
RBC: 5.04 MIL/uL (ref 4.22–5.81)
RDW: 12.9 % (ref 11.5–15.5)
WBC: 7.4 10*3/uL (ref 4.0–10.5)
nRBC: 0 % (ref 0.0–0.2)

## 2021-09-23 LAB — RESP PANEL BY RT-PCR (FLU A&B, COVID) ARPGX2
Influenza A by PCR: NEGATIVE
Influenza B by PCR: NEGATIVE
SARS Coronavirus 2 by RT PCR: NEGATIVE

## 2021-09-23 MED ORDER — ONDANSETRON 4 MG PO TBDP
4.0000 mg | ORAL_TABLET | Freq: Once | ORAL | Status: AC
  Administered 2021-09-23: 4 mg via ORAL
  Filled 2021-09-23: qty 1

## 2021-09-23 MED ORDER — ACETAMINOPHEN 500 MG PO TABS
1000.0000 mg | ORAL_TABLET | Freq: Once | ORAL | Status: AC
  Administered 2021-09-23: 1000 mg via ORAL
  Filled 2021-09-23: qty 2

## 2021-09-23 NOTE — ED Provider Notes (Signed)
[facility]  ____________________________________________  Time seen: Approximately 6:53 PM  I have reviewed the triage vital signs and the nursing notes.   HISTORY  Chief Complaint Cough, Nasal Congestion, and Shortness of Breath   Historian Patient     HPI Jon Fitzgerald is a 56 y.o. male with a history of hypertension and recent surgery in December presents to the urgent care with sensation of feeling "off".  Patient states that he runs daily and went for a run this morning.  Patient states that he felt short of breath which is atypical for him as well as severe nausea.  He states that he had some mild diarrhea throughout the day.  He denies chest pain or chest tightness but states that he does have pain in his upper back.  He denies cough.  Denies recent travel, daily smoking or prolonged immobilization.  No prior history of DVT or PE.   History reviewed. No pertinent past medical history.   Immunizations up to date:  Yes.     History reviewed. No pertinent past medical history.  There are no problems to display for this patient.   Past Surgical History:  Procedure Laterality Date   CHOLECYSTECTOMY  07/2021    Prior to Admission medications   Medication Sig Start Date End Date Taking? Authorizing Provider  ondansetron (ZOFRAN) 4 MG tablet Take 1 tablet (4 mg total) by mouth every 8 (eight) hours as needed for nausea or vomiting. Patient not taking: Reported on 01/10/2015 01/07/15   Orbie Pyo, MD  oxyCODONE-acetaminophen (PERCOCET/ROXICET) 5-325 MG per tablet Take 1-2 tablets by mouth every 6 (six) hours as needed for severe pain.    [provider]  oxyCODONE-acetaminophen (ROXICET) 5-325 MG per tablet Take 2 tablets by mouth every 4 (four) hours as needed. 01/10/15   Gregor Hams, MD  tamsulosin (FLOMAX) 0.4 MG CAPS capsule Take 1 capsule (0.4 mg total) by mouth daily. 01/07/15   Schaevitz, Randall An, MD     Allergies Patient has no known allergies.  No family history on file.  Social History Social History   Tobacco Use   Smoking status: Never  Vaping Use   Vaping Use: Never used  Substance Use Topics   Alcohol use: No   Drug use: Never     Review of Systems  Constitutional: No fever/chills Eyes:  No discharge ENT: No upper respiratory complaints. Respiratory: Patient has SOB.  Gastrointestinal: Patient has nausea and some diarrhea today.  Musculoskeletal: Negative for musculoskeletal pain. Skin: Negative for rash, abrasions, lacerations, ecchymosis.    ____________________________________________   PHYSICAL EXAM:  VITAL SIGNS: ED Triage Vitals  Enc Vitals Group     BP --      Pulse Rate 09/23/21 1831 (!) 102     Resp 09/23/21 1831 20     Temp 09/23/21 1831 98.9 F (37.2 C)     Temp Source 09/23/21 1831 Oral     SpO2 09/23/21 1831 97 %     Weight 09/23/21 1834 160 lb (72.6 kg)     Height 09/23/21 1834 5\' 9"  (1.753 m)     Head Circumference --      Peak Flow --      Pain Score 09/23/21 1831 2     Pain Loc --      Pain Edu? --      Excl. in [place]? --      Constitutional: Alert and oriented.  Patient seems mildly breathless on exam. Eyes: Conjunctivae are  normal. PERRL. EOMI. Head: Atraumatic. ENT:      Nose: No congestion/rhinnorhea.      Mouth/Throat: Mucous membranes are moist.  Neck: No stridor.  No cervical spine tenderness to palpation. Cardiovascular: Normal rate, regular rhythm. Normal S1 and S2.  Good peripheral circulation. Respiratory: Normal respiratory effort without tachypnea or retractions. Lungs CTAB. Good air entry to the bases with no decreased or absent breath sounds Gastrointestinal: Bowel sounds x 4 quadrants. Soft and nontender to palpation. No guarding or rigidity. No distention. Musculoskeletal: Full range of motion to all extremities. No obvious deformities noted Neurologic:  Normal for age. No gross focal neurologic deficits are  appreciated.  Skin:  Skin is warm, dry and intact. No rash noted. Psychiatric: Mood and affect are normal for age. Speech and behavior are normal.   ____________________________________________   LABS (all labs ordered are listed, but only abnormal results are displayed)  Labs Reviewed - No data to display ____________________________________________  EKG  Ventricular rate 90 bpm.  No ST segment elevation or other apparent arrhythmia. ____________________________________________  RADIOLOGY   No results found.  ____________________________________________    PROCEDURES  Procedure(s) performed:     Procedures     Medications - No data to display   ____________________________________________   INITIAL IMPRESSION / ASSESSMENT AND PLAN / ED COURSE  Pertinent labs & imaging results that were available during my care of the patient were reviewed by me and considered in my medical decision making (see chart for details).      Assessment and plan Shortness of breath Nausea 56 year old male with a history of hypertension and recent surgery presents to the urgent care with shortness of breath, nausea and upper back discomfort.  Differential diagnosis includes PE, NSTEMI, unspecified viral infection, gastroenteritis...  Patient was tachycardic at triage and mildly tachypneic.  He had good breath sounds in the lung bases without wheezing.  Given PERC criteria, PE cannot be ruled out.  We do not have access to CTA or D-dimer here in the urgent care setting.  Will refer patient to the emergency department for further care and management.     ____________________________________________  FINAL CLINICAL IMPRESSION(S) / ED DIAGNOSES  Final diagnoses:  Shortness of breath      NEW MEDICATIONS STARTED DURING THIS VISIT:  ED Discharge Orders     None           This chart was dictated using voice recognition software/Dragon. Despite best efforts to  proofread, errors can occur which can change the meaning. Any change was purely unintentional.     Lannie Fields, [place] 09/23/21 1908

## 2021-09-23 NOTE — ED Triage Notes (Signed)
Pt presents via POV with complaints of SOB, diarrhea, back pain, chills, and nausea. Pt was seen at [facility] and was afebrile there and is now febrile with a temp of 102.1 oral. Of note, the patient had gallbladder sx in December 2022 and has intermittent diarrhea. Denies CP.

## 2021-09-23 NOTE — ED Triage Notes (Signed)
Patient is here for "nausea, fatigue, chills, diarrhea, abd pain/cramp". No fever known. Some sob. @ home COVID19 test "Negative". Gallbladder out "end of Dec. 2022".

## 2021-09-23 NOTE — Discharge Instructions (Signed)
Please go to emergency department at [facility]. °

## 2022-09-21 IMAGING — CR DG CHEST 2V
1 series · 2 of 2 positions shown · non-contrast
Comparison: None.

CLINICAL DATA: Pt presents via POV with complaints of SOB,
diarrhea, back pain, chills, and nausea. Pt was seen [REDACTED] PTA and
was afebrile there and is now febrile with a temp of 102.1 [place]

EXAM:
CHEST - 2 VIEW

[Series 1: dg chest 2 view · 0.14mm/px · 2 of 2 slices shown]
[im 1/2]
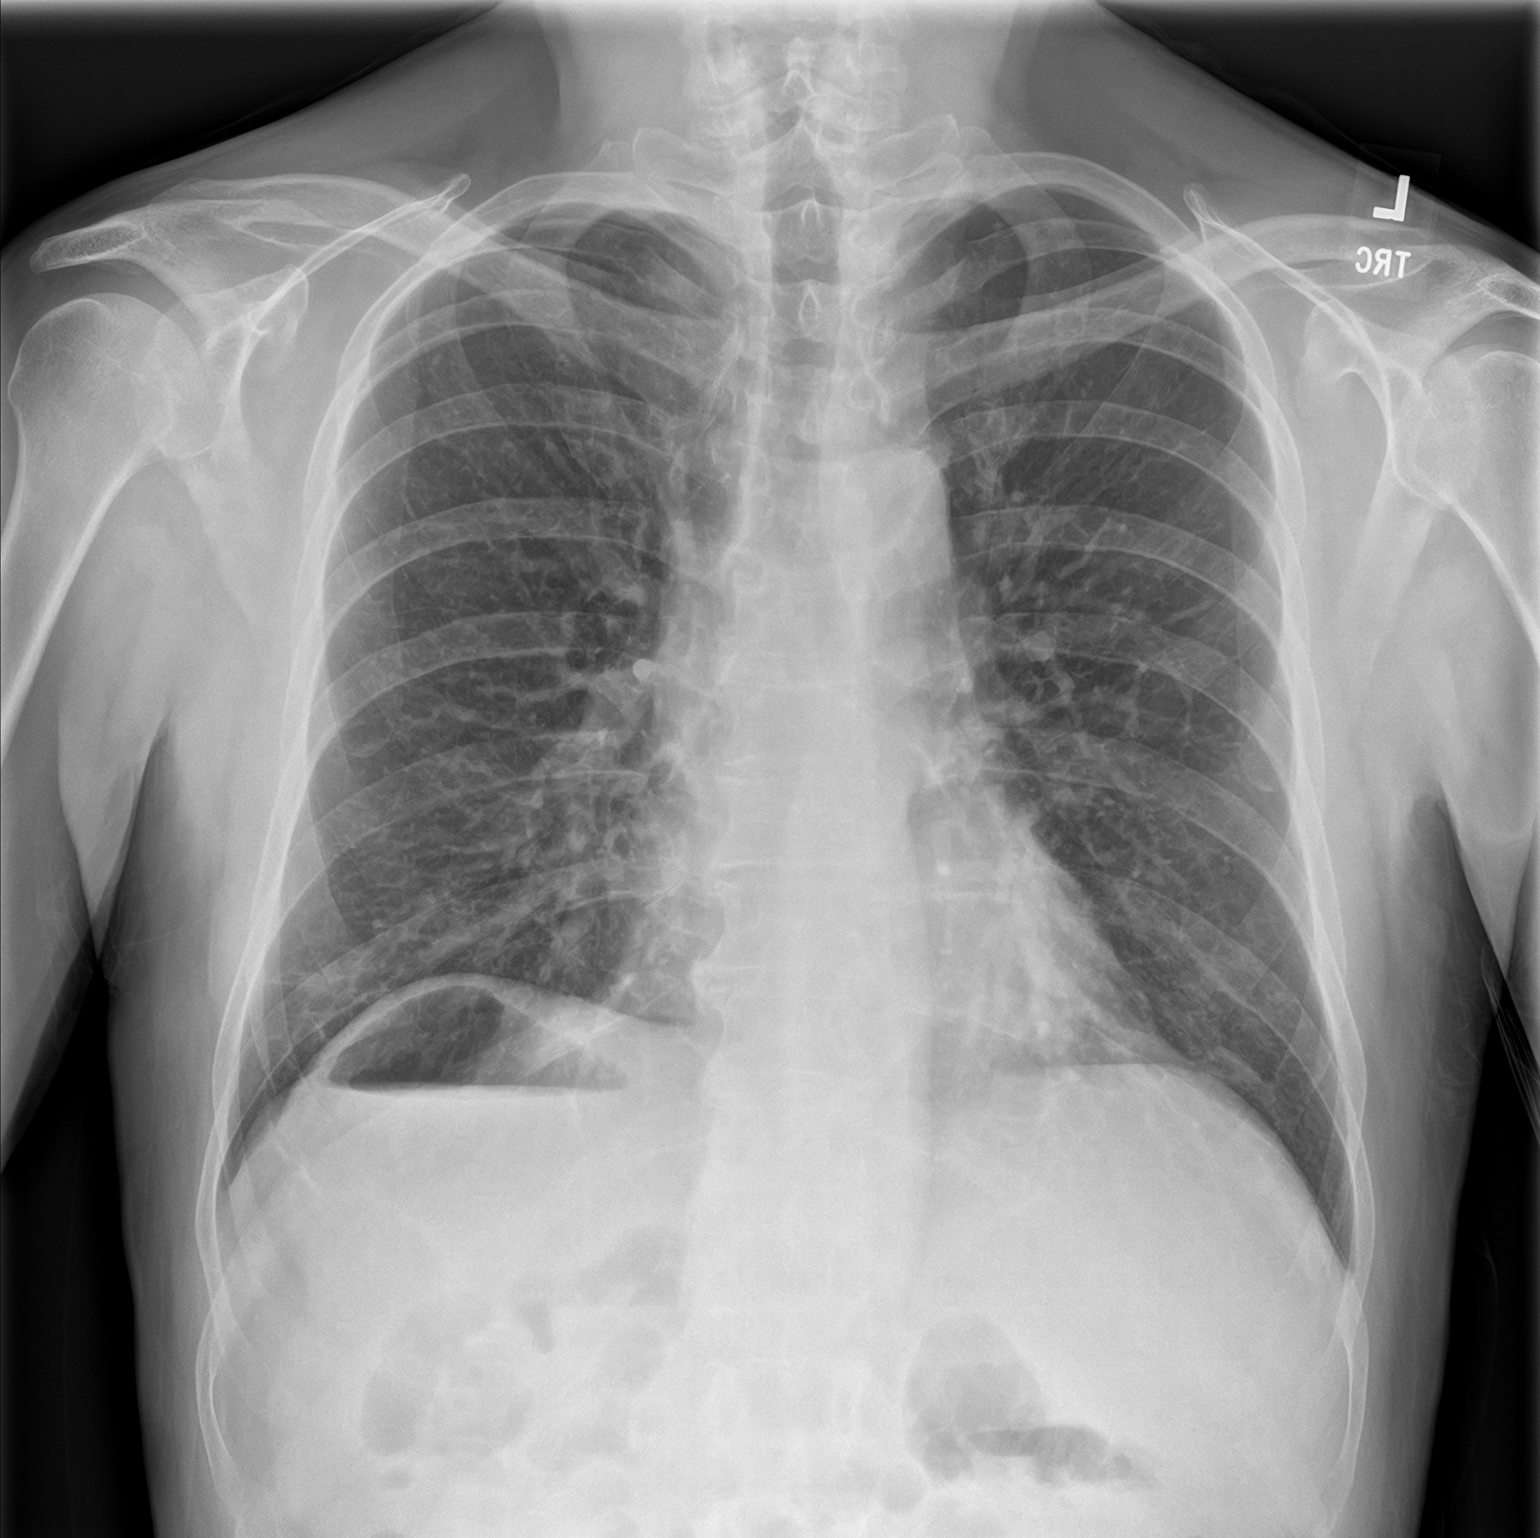
[im 2/2]
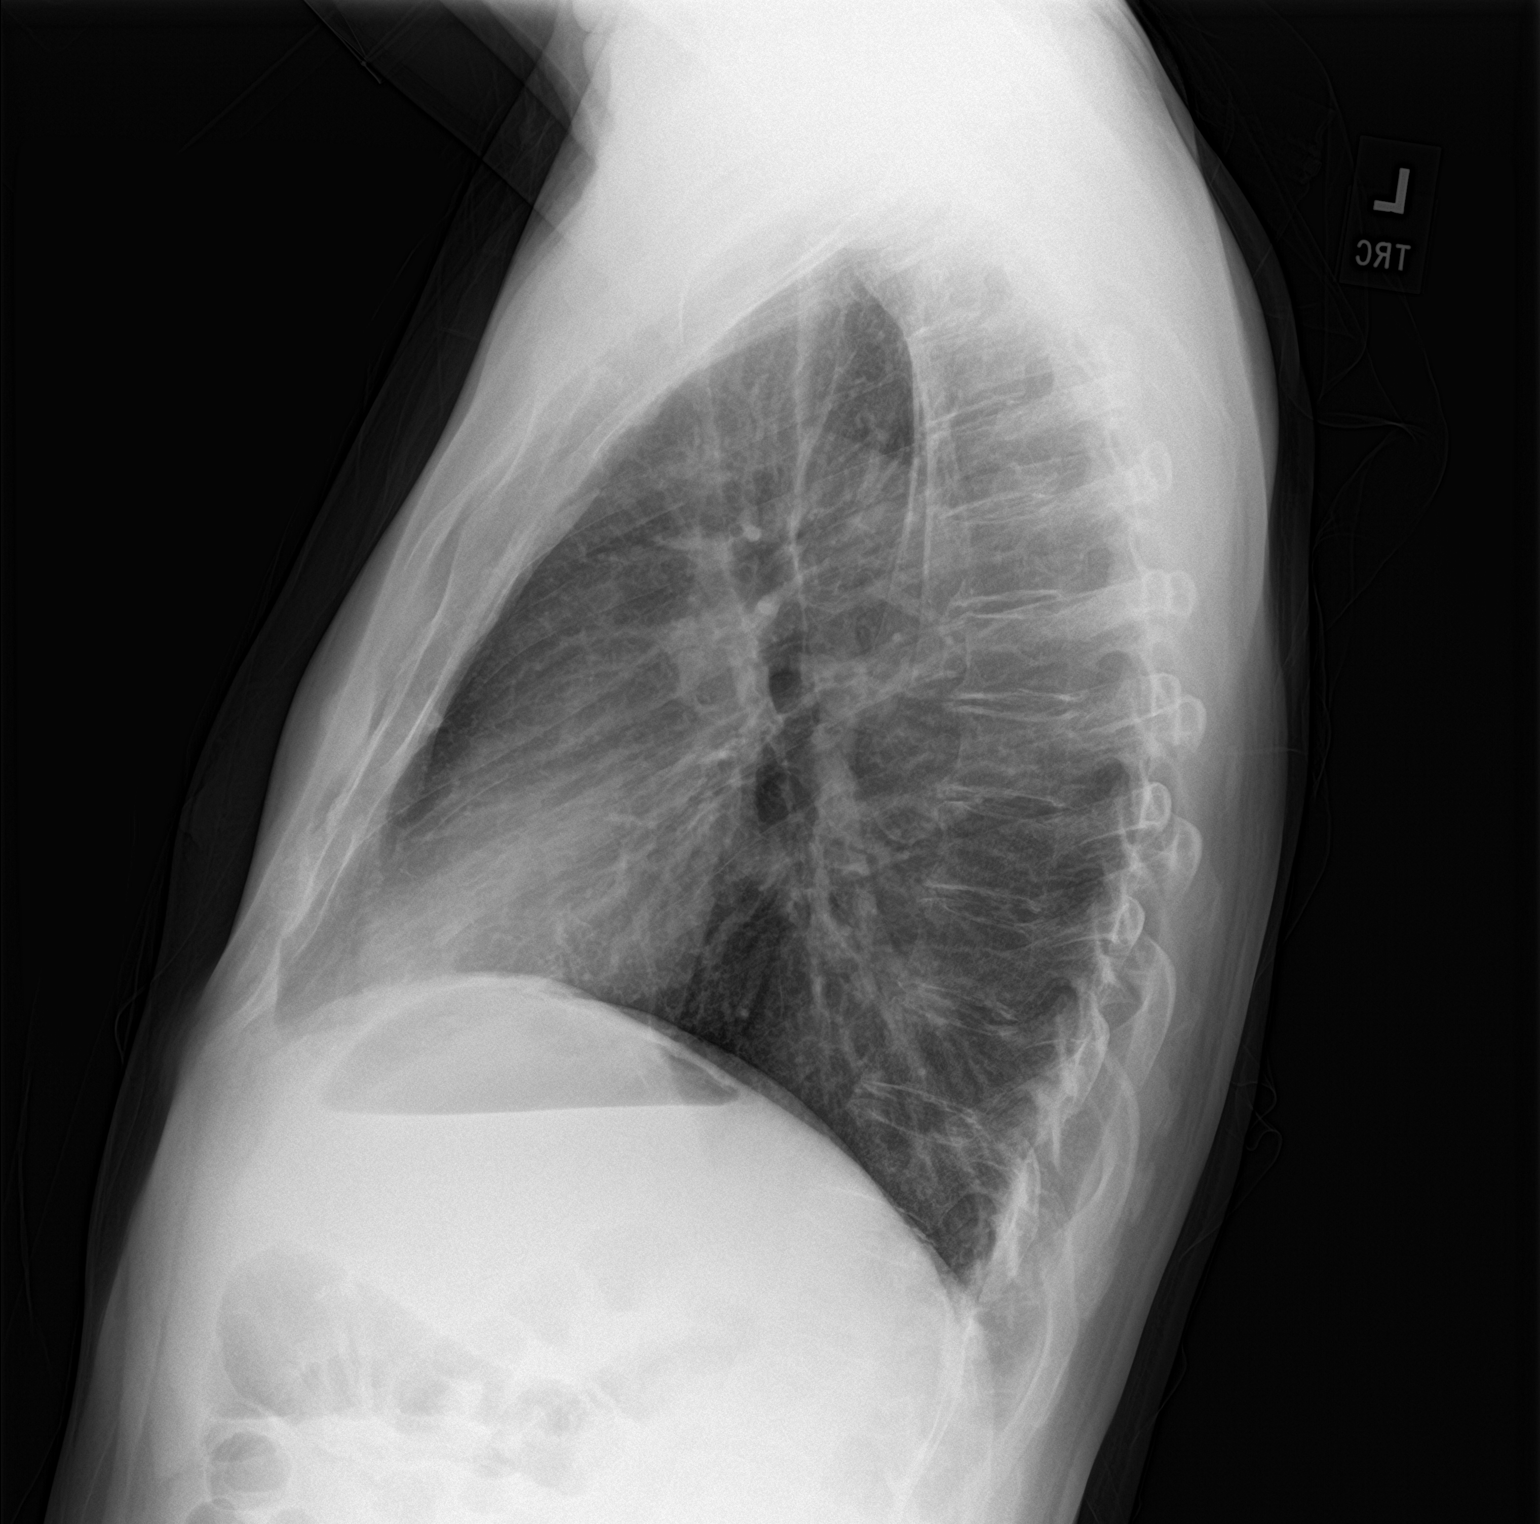

[2 of 2 positions shown; findings below may reference images not displayed]

FINDINGS: The heart and mediastinal contours are within normal limits.

No focal consolidation. Slightly coarsened markings with no overt
pulmonary edema. No pleural effusion. No pneumothorax.

No acute osseous abnormality.
IMPRESSION: No active cardiopulmonary disease.
